# Patient Record
Sex: Male | Born: 2000 | Race: White | Hispanic: No | Marital: Single | Smoking: Never smoker
Health system: Southern US, Community
[De-identification: ages and names within clinical notes are randomized; demographics above are authoritative.]

## PROBLEM LIST (undated history)

## (undated) DIAGNOSIS — W3400XA Accidental discharge from unspecified firearms or gun, initial encounter: Secondary | ICD-10-CM

## (undated) DIAGNOSIS — Y249XXA Unspecified firearm discharge, undetermined intent, initial encounter: Secondary | ICD-10-CM

## (undated) HISTORY — PX: RENAL BIOPSY: SHX156

---
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
## 2019-01-22 ENCOUNTER — Telehealth: Payer: Self-pay | Admitting: Nurse Practitioner

## 2019-01-22 DIAGNOSIS — Z20822 Contact with and (suspected) exposure to covid-19: Secondary | ICD-10-CM

## 2019-01-22 DIAGNOSIS — R6889 Other general symptoms and signs: Principal | ICD-10-CM

## 2019-01-22 NOTE — Progress Notes (Signed)
E-Visit for Corona Virus Screening  Based on your current symptoms, you may very well have the virus, however your symptoms are mild. Currently, not all patients are being tested. If the symptoms are mild and there is not a known exposure, performing the test is not indicated.  Coronavirus disease 2019 (COVID-19) is a respiratory illness that can spread from person to person. The virus that causes COVID-19 is a new virus that was first identified in the country of [place] but is now found in multiple other countries and has spread to the [place].  Symptoms associated with the virus are mild to severe fever, cough, and shortness of breath. There is currently no vaccine to protect against COVID-19, and there is no specific antiviral treatment for the virus.   To be considered HIGH RISK for Coronavirus (COVID-19), you have to meet the following criteria:  . Traveled to [place], [place], [place], [place] or [place]; or in the [place] to [place], [place], [place], or [place]; and have fever, cough, and shortness of breath within the last 2 weeks of travel OR  . Been in close contact with a person diagnosed with COVID-19 within the last 2 weeks and have fever, cough, and shortness of breath  . IF YOU DO NOT MEET THESE CRITERIA, YOU ARE CONSIDERED LOW RISK FOR COVID-19.   It is vitally important that if you feel that you have an infection such as this virus or any other virus that you stay home and away from places where you may spread it to others.  You should self-quarantine for 14 days if you have symptoms that could potentially be coronavirus and avoid contact with people age 65 and older.   You can use medication such as delsym or mucinex OTC for cough  You may also take acetaminophen (Tylenol) as needed for fever.   Reduce your risk of any infection by using the same precautions used for avoiding the common cold or flu:  . Wash your hands often with soap and warm water for at least  20 seconds.  If soap and water are not readily available, use an alcohol-based hand sanitizer with at least 60% alcohol.  . If coughing or sneezing, cover your mouth and nose by coughing or sneezing into the elbow areas of your shirt or coat, into a tissue or into your sleeve (not your hands). . Avoid shaking hands with others and consider head nods or verbal greetings only. . Avoid touching your eyes, nose, or mouth with unwashed hands.  . Avoid close contact with people who are sick. . Avoid places or events with large numbers of people in one location, like concerts or sporting events. . Carefully consider travel plans you have or are making. . If you are planning any travel outside or inside the [place], visit the CDC's Travelers' Health webpage for the latest health notices. . If you have some symptoms but not all symptoms, continue to monitor at home and seek medical attention if your symptoms worsen. . If you are having a medical emergency, call 911.  HOME CARE . Only take medications as instructed by your medical team. . Drink plenty of fluids and get plenty of rest. . A steam or ultrasonic humidifier can help if you have congestion.   GET HELP RIGHT AWAY IF: . You develop worsening fever. . You become short of breath . You cough up blood. . Your symptoms become more severe MAKE SURE YOU   Understand these   instructions.  Will watch your condition.  Will get help right away if you are not doing well or get worse.  Your e-visit answers were reviewed by a board certified advanced clinical practitioner to complete your personal care plan.  Depending on the condition, your plan could have included both over the counter or prescription medications.  If there is a problem please reply once you have received a response from your provider. Your safety is important to us.  If you have drug allergies check your prescription carefully.    You can use MyChart to ask questions about today's  visit, request a non-urgent call back, or ask for a work or school excuse for 24 hours related to this e-Visit. If it has been greater than 24 hours you will need to follow up with your provider, or enter a new e-Visit to address those concerns. You will get an e-mail in the next two days asking about your experience.  I hope that your e-visit has been valuable and will speed your recovery. Thank you for using e-visits.   5 minutes spent reviewing and documenting in chart.  

## 2019-08-30 ENCOUNTER — Ambulatory Visit (INDEPENDENT_AMBULATORY_CARE_PROVIDER_SITE_OTHER): Payer: Self-pay | Admitting: Family Medicine

## 2019-08-30 ENCOUNTER — Other Ambulatory Visit: Payer: Self-pay

## 2019-08-30 ENCOUNTER — Ambulatory Visit
Admission: EM | Admit: 2019-08-30 | Discharge: 2019-08-30 | Disposition: A | Discharge status: Home / Self Care | Payer: Self-pay | Attending: Emergency Medicine | Admitting: Emergency Medicine

## 2019-08-30 ENCOUNTER — Encounter: Payer: Self-pay | Admitting: Family Medicine

## 2019-08-30 DIAGNOSIS — R112 Nausea with vomiting, unspecified: Secondary | ICD-10-CM

## 2019-08-30 DIAGNOSIS — R509 Fever, unspecified: Secondary | ICD-10-CM

## 2019-08-30 DIAGNOSIS — Z20828 Contact with and (suspected) exposure to other viral communicable diseases: Secondary | ICD-10-CM

## 2019-08-30 DIAGNOSIS — B349 Viral infection, unspecified: Secondary | ICD-10-CM

## 2019-08-30 DIAGNOSIS — Z20822 Contact with and (suspected) exposure to covid-19: Secondary | ICD-10-CM

## 2019-08-30 MED ORDER — PROMETHAZINE HCL 25 MG PO TABS
25.0000 mg | ORAL_TABLET | Freq: Three times a day (TID) | ORAL | 0 refills | Status: DC | PRN
Start: 2019-08-30 — End: 2019-09-06

## 2019-08-30 NOTE — Discharge Instructions (Addendum)
COVID testing ordered.  It will take between 5-7 days for test results.  Someone will contact you regarding abnormal results.    In the meantime: You should remain isolated in your home for 10 days from symptom onset AND greater than 72 hours after symptoms resolution (absence of fever without the use of fever-reducing medication and improvement in respiratory symptoms), whichever is longer Get plenty of rest and push fluids Begin phenergan as prescribed for nausea Use OTC medications like ibuprofen or tylenol as needed fever or pain Call or go to the ED if you have any new or worsening symptoms such as fever, worsening cough, shortness of breath, chest tightness, chest pain, turning blue, changes in mental status, etc..Marland Kitchen

## 2019-08-30 NOTE — ED Triage Notes (Signed)
Pt presents for covid testing after video visit with primary, symptoms of nausea and vomiting that began this morning . Pt states his dad has been sick as well but uncertain if positive, test is pending

## 2019-08-30 NOTE — Progress Notes (Signed)
Virtual Visit via Telephone Note  I connected with Christopher Holden. on 08/30/19 at 12:40pm  by telephone and verified that I am speaking with the correct person using two identifiers.      Pt location: at home   Physician location:  In office, [facility], Vic Blackbird MD     On call: patient and physician   I discussed the limitations, risks, security and privacy concerns of performing an evaluation and management service by telephone and the availability of in person appointments. I also discussed with the patient that there may be a patient responsible charge related to this service. The patient expressed understanding and agreed to proceed.   History of Present Illness:  His Father has been sick for past 3 day, with cough, congestion, HA.  Last night he had fever, was very hot and sweaty , states temp was 99.61F, he had headache and overall felt unwell. This AM got up and felt very queasy and sick on the stomach.    He has vomiting x 4 since this early morning  he had mild cough with congestion since last week.. + Fatigue  No diarrhea  Too Dramanine to help nausea this AM  He does work for [employer] and goes in and out of homes a few times a day    [employer]   [address]      Observations/Objective: NAD noted on phone, speaking full sentences   Assessment and Plan: Viral illness- concern for COVID-19 with sick contact in household, N/V. Send for testing  for the N/V given phenergan tablets. If unable to tolerate fluids despite meds, pt to go to nearest ER Given note for his work   Follow Up Instructions:    I discussed the assessment and treatment plan with the patient. The patient was provided an opportunity to ask questions and all were answered. The patient agreed with the plan and demonstrated an understanding of the instructions.   The patient was advised to call back or seek an in-person evaluation if the  symptoms worsen or if the condition fails to improve as anticipated.  I provided 8 minutes of non-face-to-face time during this encounter. End time 12:48pm  Vic Blackbird, MD

## 2019-08-30 NOTE — ED Provider Notes (Signed)
Christopher Holden   [id number] 08/30/19 Arrival Time: 4098   CC: Nausea, vomiting, COVID test  SUBJECTIVE: History from: patient.  Christopher Holden. is a 18 y.o. male who presents with nausea and 4 episodes of vomiting that began this morning.  Reports father with similar symptoms, COVID test pending.  Denies recent travel.  Has NOT tried OTC medications.  Had VV with PCP this morning, and sent in phenergan and recommended COVID testing.  Symptoms are made worse with eating, but keeping sips of fluids down without difficulty.   Denies fever, chills, fatigue, sinus pain, rhinorrhea, congestion, sore throat, cough, SOB, wheezing, chest pain, changes in bowel or bladder habits.    ROS: As per HPI.  All other pertinent ROS negative.     History reviewed. No pertinent past medical history. Past Surgical History:  Procedure Laterality Date  . RENAL BIOPSY     No Known Allergies No current facility-administered medications on file prior to encounter.    Current Outpatient Medications on File Prior to Encounter  Medication Sig Dispense Refill  . promethazine (PHENERGAN) 25 MG tablet Take 1 tablet (25 mg total) by mouth every 8 (eight) hours as needed for nausea or vomiting. 12 tablet 0   Social History   Socioeconomic History  . Marital status: Single    Spouse name: Not on file  . Number of children: Not on file  . Years of education: Not on file  . Highest education level: Not on file  Occupational History  . Not on file  Social Needs  . Financial resource strain: Not on file  . Food insecurity    Worry: Not on file    Inability: Not on file  . Transportation needs    Medical: Not on file    Non-medical: Not on file  Tobacco Use  . Smoking status: Never Smoker  . Smokeless tobacco: Never Used  Substance and Sexual Activity  . Alcohol use: No  . Drug use: Yes    Frequency: 1.0 times per week    Types: Marijuana  . Sexual activity: Not on file  Lifestyle  .  Physical activity    Days per week: Not on file    Minutes per session: Not on file  . Stress: Not on file  Relationships  . Social Herbalist on phone: Not on file    Gets together: Not on file    Attends religious service: Not on file    Active member of club or organization: Not on file    Attends meetings of clubs or organizations: Not on file    Relationship status: Not on file  . Intimate partner violence    Fear of current or ex partner: Not on file    Emotionally abused: Not on file    Physically abused: Not on file    Forced sexual activity: Not on file  Other Topics Concern  . Not on file  Social History Narrative  . Not on file   Family History  Family history unknown: Yes    OBJECTIVE:  Vitals:   08/30/19 1359  BP: 122/71  Pulse: 92  Resp: 20  Temp: 98.7 F (37.1 C)  SpO2: 96%     General appearance: alert; well-appearing, nontoxic; speaking in full sentences and tolerating own secretions HEENT: NCAT; Ears: EACs clear, TMs pearly gray; Eyes: PERRL.  EOM grossly intact. Nose: nares patent without rhinorrhea, Throat: oropharynx clear, tonsils non erythematous or enlarged, uvula midline  Neck: supple without LAD Lungs: unlabored respirations, symmetrical air entry; cough: absent; no respiratory distress; CTAB Heart: regular rate and rhythm.  Abdomen: soft, nondistended, normal active bowel sounds; nontender to palpation; no guarding  Back: no CVA tenderness Skin: warm and dry Psychological: alert and cooperative; normal mood and affect  ASSESSMENT & PLAN:  1. Suspected COVID-19 virus infection   2. Exposure to COVID-19 virus   3. Non-intractable vomiting with nausea, unspecified vomiting type    COVID testing ordered.  It will take between 5-7 days for test results.  Someone will contact you regarding abnormal results.    In the meantime: You should remain isolated in your home for 10 days from symptom onset AND greater than 72 hours after  symptoms resolution (absence of fever without the use of fever-reducing medication and improvement in respiratory symptoms), whichever is longer Get plenty of rest and push fluids Begin phenergan as prescribed for nausea Use OTC medications like ibuprofen or tylenol as needed fever or pain Call or go to the ED if you have any new or worsening symptoms such as fever, worsening cough, shortness of breath, chest tightness, chest pain, turning blue, changes in mental status, etc...   Reviewed expectations re: course of current medical issues. Questions answered. Outlined signs and symptoms indicating need for more acute intervention. Patient verbalized understanding. After Visit Summary given.         Rennis Harding, PA-C 08/30/19 1433

## 2019-08-31 LAB — NOVEL CORONAVIRUS, NAA: SARS-CoV-2, NAA: NOT DETECTED

## 2019-09-06 ENCOUNTER — Other Ambulatory Visit: Payer: Self-pay | Admitting: Family Medicine

## 2019-09-06 MED ORDER — PROMETHAZINE HCL 25 MG PO TABS: 25.0000 mg | ORAL_TABLET | Freq: Three times a day (TID) | ORAL | 0 refills | Status: DC | PRN

## 2019-10-01 ENCOUNTER — Other Ambulatory Visit: Payer: Self-pay

## 2019-10-01 DIAGNOSIS — Z20822 Contact with and (suspected) exposure to covid-19: Secondary | ICD-10-CM

## 2019-10-01 DIAGNOSIS — Z20828 Contact with and (suspected) exposure to other viral communicable diseases: Secondary | ICD-10-CM | POA: Diagnosis not present

## 2019-10-02 LAB — NOVEL CORONAVIRUS, NAA: SARS-CoV-2, NAA: NOT DETECTED

## 2019-10-04 ENCOUNTER — Ambulatory Visit (INDEPENDENT_AMBULATORY_CARE_PROVIDER_SITE_OTHER): Payer: Self-pay | Admitting: Family Medicine

## 2019-10-04 DIAGNOSIS — A084 Viral intestinal infection, unspecified: Secondary | ICD-10-CM

## 2019-10-04 MED ORDER — DIPHENOXYLATE-ATROPINE 2.5-0.025 MG PO TABS: 2.0000 | ORAL_TABLET | Freq: Four times a day (QID) | ORAL | 0 refills | Status: DC | PRN

## 2019-10-04 MED ORDER — ONDANSETRON HCL 4 MG PO TABS: 4.0000 mg | ORAL_TABLET | Freq: Three times a day (TID) | ORAL | 0 refills | Status: DC | PRN

## 2019-10-04 NOTE — Progress Notes (Signed)
Subjective:    Patient ID: Christopher Fix., male    DOB: [birth year], 18 y.o.   MRN: [id number]  HPI Patient is being seen today as a telephone visit.  Phone call began at 1210.  Phone call concluded at 1220.  Patient states his symptoms began last Thursday.  Around Thursday at 7 PM he developed some vague crampy abdominal pain in the epigastric area.  He developed a low-grade headache.  This occurred after eating dinner.  Friday early morning he vomited 4 times in rapid succession.  Since that time he has not vomited anymore however he feels extremely nauseated.  He is unable to eat anything due to lack of appetite and nausea.  If he does eat anything within 30 minutes he is having to go to the bathroom which is severe watery diarrhea.  He denies any hematochezia.  He is having crampy abdominal discomfort but no severe pain.  He does report chills.  He reports a low-grade temperature to 99.8.  He denies any rhinorrhea, cough, congestion, shortness of breath.  He tested negative for Covid on Friday. No past medical history on file. Past Surgical History:  Procedure Laterality Date  . RENAL BIOPSY     Current Outpatient Medications on File Prior to Visit  Medication Sig Dispense Refill  . promethazine (PHENERGAN) 25 MG tablet Take 1 tablet (25 mg total) by mouth every 8 (eight) hours as needed for nausea or vomiting. 12 tablet 0   No current facility-administered medications on file prior to visit.   No Known Allergies Social History   Socioeconomic History  . Marital status: Single    Spouse name: Not on file  . Number of children: Not on file  . Years of education: Not on file  . Highest education level: Not on file  Occupational History  . Not on file  Tobacco Use  . Smoking status: Never Smoker  . Smokeless tobacco: Never Used  Substance and Sexual Activity  . Alcohol use: No  . Drug use: Yes    Frequency: 1.0 times per week    Types: Marijuana  . Sexual activity: Not on  file  Other Topics Concern  . Not on file  Social History Narrative  . Not on file   Social Determinants of Health   Financial Resource Strain:   . Difficulty of Paying Living Expenses: Not on file  Food Insecurity:   . Worried About Charity fundraiser in the Last Year: Not on file  . Ran Out of Food in the Last Year: Not on file  Transportation Needs:   . Lack of Transportation (Medical): Not on file  . Lack of Transportation (Non-Medical): Not on file  Physical Activity:   . Days of Exercise per Week: Not on file  . Minutes of Exercise per Session: Not on file  Stress:   . Feeling of Stress : Not on file  Social Connections:   . Frequency of Communication with Friends and Family: Not on file  . Frequency of Social Gatherings with Friends and Family: Not on file  . Attends Religious Services: Not on file  . Active Member of Clubs or Organizations: Not on file  . Attends Archivist Meetings: Not on file  . Marital Status: Not on file  Intimate Partner Violence:   . Fear of Current or Ex-Partner: Not on file  . Emotionally Abused: Not on file  . Physically Abused: Not on file  . Sexually Abused: Not  on file      Review of Systems  All other systems reviewed and are negative.      Objective:   Physical Exam        Assessment & Plan:  Viral gastroenteritis  Patient consented to be seen over the telephone.  Patient symptoms sound consistent with viral gastroenteritis versus food poisoning.  I favor viral gastroenteritis given the low-grade fever.  Recommended Zofran 4 mg every 6-8 hours as needed for nausea.  Recommended a brat diet.  Recommended that he push fluids.  He can use Lomotil 1 to 2 tablets every 6 hours as needed for diarrhea.  Reassess if no better in 48 hours or sooner if worsening.

## 2019-10-12 ENCOUNTER — Telehealth: Payer: Self-pay | Admitting: Emergency Medicine

## 2019-10-12 DIAGNOSIS — R1084 Generalized abdominal pain: Secondary | ICD-10-CM

## 2019-10-12 NOTE — Progress Notes (Signed)
Based on what you shared with me, I feel your condition warrants further evaluation and I recommend that you be seen for a face to face office visit.  While this may be nothing more than constipation, I cannot adequately exam or diagnose you via e-visit.  You should be seen in person.   NOTE: If you entered your credit card information for this eVisit, you will not be charged. You may see a "hold" on your card for the $35 but that hold will drop off and you will not have a charge processed.   If you are having a true medical emergency please call 911.      For an urgent face to face visit, [place] has five urgent care centers for your convenience:      NEW:  [facility] [place] Get Driving Directions [phone] [address] [place], [place] [address] . 10 am - 6pm Monday - Friday    [facility]) Get Driving Directions [phone] [address] [place], [place] [address] . 10 am to 8 pm Monday-Friday . 12 pm to 8 pm [facility] Get Driving Directions [phone] [place], [place], [place] [address] . 8 am to 8 pm Monday-Friday . 9 am to 6 pm Saturday . 11 am to 6 pm Sunday     [facility] Get Driving Directions  [phone] [address], [place] [address] . 8 am to 8 pm Monday-Friday . 8 am to 4 pm [facility] [place] Get Driving Directions [phone] [address] [place], [place] [address] . 12 pm to 6 pm Monday-Friday      Your e-visit answers were reviewed by a board certified advanced clinical practitioner to complete your personal care plan.  Thank you for using e-Visits.   Approximately 5 minutes was used in reviewing the patient's chart, questionnaire, prescribing medications, and documentation.

## 2019-10-15 ENCOUNTER — Other Ambulatory Visit: Payer: Self-pay

## 2019-10-15 ENCOUNTER — Emergency Department (HOSPITAL_COMMUNITY): Payer: No Typology Code available for payment source

## 2019-10-15 ENCOUNTER — Emergency Department (HOSPITAL_COMMUNITY)
Admission: EM | Admit: 2019-10-15 | Discharge: 2019-10-16 | Disposition: A | Discharge status: Home / Self Care | Payer: No Typology Code available for payment source | Attending: Emergency Medicine | Admitting: Emergency Medicine

## 2019-10-15 ENCOUNTER — Encounter (HOSPITAL_COMMUNITY): Payer: Self-pay | Admitting: Emergency Medicine

## 2019-10-15 DIAGNOSIS — Z20828 Contact with and (suspected) exposure to other viral communicable diseases: Secondary | ICD-10-CM | POA: Insufficient documentation

## 2019-10-15 DIAGNOSIS — Y929 Unspecified place or not applicable: Secondary | ICD-10-CM | POA: Insufficient documentation

## 2019-10-15 DIAGNOSIS — S21201A Unspecified open wound of right back wall of thorax without penetration into thoracic cavity, initial encounter: Secondary | ICD-10-CM | POA: Diagnosis not present

## 2019-10-15 DIAGNOSIS — W3400XA Accidental discharge from unspecified firearms or gun, initial encounter: Secondary | ICD-10-CM | POA: Insufficient documentation

## 2019-10-15 DIAGNOSIS — Y9389 Activity, other specified: Secondary | ICD-10-CM | POA: Insufficient documentation

## 2019-10-15 DIAGNOSIS — S299XXA Unspecified injury of thorax, initial encounter: Secondary | ICD-10-CM | POA: Diagnosis present

## 2019-10-15 DIAGNOSIS — Y998 Other external cause status: Secondary | ICD-10-CM | POA: Insufficient documentation

## 2019-10-15 LAB — COMPREHENSIVE METABOLIC PANEL
ALT: 25 U/L (ref 0–44)
AST: 35 U/L (ref 15–41)
Albumin: 4.6 g/dL (ref 3.5–5.0)
Alkaline Phosphatase: 64 U/L (ref 38–126)
Anion gap: 12 (ref 5–15)
BUN: 13 mg/dL (ref 6–20)
CO2: 22 mmol/L (ref 22–32)
Calcium: 9.7 mg/dL (ref 8.9–10.3)
Chloride: 103 mmol/L (ref 98–111)
Creatinine, Ser: 1.01 mg/dL (ref 0.61–1.24)
GFR calc Af Amer: >60 mL/min (ref >60)
GFR calc non Af Amer: >60 mL/min (ref >60)
Glucose, Bld: 185 mg/dL — ABNORMAL HIGH (ref 70–99)
Potassium: 3.5 mmol/L (ref 3.5–5.1)
Sodium: 137 mmol/L (ref 135–145)
Total Bilirubin: 0.4 mg/dL (ref 0.3–1.2)
Total Protein: 7.4 g/dL (ref 6.5–8.1)

## 2019-10-15 LAB — I-STAT CHEM 8, ED
BUN: 15 mg/dL (ref 6–20)
Calcium, Ion: 1.17 mmol/L (ref 1.15–1.40)
Chloride: 102 mmol/L (ref 98–111)
Creatinine, Ser: 0.9 mg/dL (ref 0.61–1.24)
Glucose, Bld: 180 mg/dL — ABNORMAL HIGH (ref 70–99)
HCT: 47 % (ref 39.0–52.0)
Hemoglobin: 16 g/dL (ref 13.0–17.0)
Potassium: 3.4 mmol/L — ABNORMAL LOW (ref 3.5–5.1)
Sodium: 138 mmol/L (ref 135–145)
TCO2: 24 mmol/L (ref 22–32)

## 2019-10-15 LAB — CBC
HCT: 45 % (ref 39.0–52.0)
Hemoglobin: 16 g/dL (ref 13.0–17.0)
MCH: 30.7 pg (ref 26.0–34.0)
MCHC: 35.6 g/dL (ref 30.0–36.0)
MCV: 86.2 fL (ref 80.0–100.0)
Platelets: 238 10*3/uL (ref 150–400)
RBC: 5.22 MIL/uL (ref 4.22–5.81)
RDW: 11.7 % (ref 11.5–15.5)
WBC: 13.4 10*3/uL — ABNORMAL HIGH (ref 4.0–10.5)
nRBC: 0 % (ref 0.0–0.2)

## 2019-10-15 LAB — RESPIRATORY PANEL BY RT PCR (FLU A&B, COVID)
Influenza A by PCR: NEGATIVE
Influenza B by PCR: NEGATIVE
SARS Coronavirus 2 by RT PCR: NEGATIVE

## 2019-10-15 LAB — PROTIME-INR
INR: 0.9 (ref 0.8–1.2)
Prothrombin Time: 12.4 seconds (ref 11.4–15.2)

## 2019-10-15 LAB — ETHANOL: Alcohol, Ethyl (B): <10 mg/dL (ref <10)

## 2019-10-15 LAB — SAMPLE TO BLOOD BANK

## 2019-10-15 LAB — CBG MONITORING, ED: Glucose-Capillary: 169 mg/dL — ABNORMAL HIGH (ref 70–99)

## 2019-10-15 LAB — LACTIC ACID, PLASMA: Lactic Acid, Venous: 3 mmol/L (ref 0.5–1.9)

## 2019-10-15 LAB — CDS SEROLOGY

## 2019-10-15 MED ORDER — TETANUS-DIPHTH-ACELL PERTUSSIS 5-2.5-18.5 LF-MCG/0.5 IM SUSP
0.5000 mL | Freq: Once | INTRAMUSCULAR | Status: AC
  Administered 2019-10-15: 0.5 mL via INTRAMUSCULAR

## 2019-10-15 MED ORDER — CEFAZOLIN SODIUM-DEXTROSE 2-4 GM/100ML-% IV SOLN
2.0000 g | Freq: Once | INTRAVENOUS | Status: AC
  Administered 2019-10-15: 2 g via INTRAVENOUS

## 2019-10-15 MED ORDER — SODIUM CHLORIDE 0.9 % IV SOLN
INTRAVENOUS | Status: AC | PRN
  Administered 2019-10-15: 1000 mL via INTRAVENOUS

## 2019-10-15 MED ORDER — SODIUM CHLORIDE 0.9 % IV BOLUS
2000.0000 mL | Freq: Once | INTRAVENOUS | Status: AC
  Administered 2019-10-15: 2000 mL via INTRAVENOUS

## 2019-10-15 MED ORDER — IOHEXOL 350 MG/ML SOLN
100.0000 mL | Freq: Once | INTRAVENOUS | Status: AC | PRN
  Administered 2019-10-15: 100 mL via INTRAVENOUS

## 2019-10-15 MED ORDER — LORAZEPAM 2 MG/ML IJ SOLN
1.0000 mg | Freq: Once | INTRAMUSCULAR | Status: AC
  Administered 2019-10-15: 1 mg via INTRAVENOUS
  Filled 2019-10-15: qty 1

## 2019-10-15 MED ORDER — SODIUM CHLORIDE 0.9 % IV SOLN: INTRAVENOUS | Status: DC

## 2019-10-15 NOTE — ED Notes (Signed)
MD informed of 3.0 lactic acid
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
## 2019-10-16 LAB — URINALYSIS, ROUTINE W REFLEX MICROSCOPIC
Bilirubin Urine: NEGATIVE
Glucose, UA: NEGATIVE mg/dL
Hgb urine dipstick: NEGATIVE
Ketones, ur: NEGATIVE mg/dL
Leukocytes,Ua: NEGATIVE
Nitrite: NEGATIVE
Protein, ur: NEGATIVE mg/dL
Specific Gravity, Urine: 1.028 (ref 1.005–1.030)
pH: 6 (ref 5.0–8.0)

## 2019-10-16 LAB — RAPID URINE DRUG SCREEN, HOSP PERFORMED
Amphetamines: NOT DETECTED
Barbiturates: NOT DETECTED
Benzodiazepines: NOT DETECTED
Cocaine: NOT DETECTED
Opiates: NOT DETECTED
Tetrahydrocannabinol: POSITIVE — AB

## 2019-10-16 LAB — LACTIC ACID, PLASMA: Lactic Acid, Venous: 1.3 mmol/L (ref 0.5–1.9)

## 2019-10-16 MED ORDER — MORPHINE SULFATE (PF) 4 MG/ML IV SOLN
4.0000 mg | Freq: Once | INTRAVENOUS | Status: AC
  Administered 2019-10-16: 4 mg via INTRAVENOUS
  Filled 2019-10-16: qty 1

## 2019-10-16 MED ORDER — IBUPROFEN 800 MG PO TABS: 800.0000 mg | ORAL_TABLET | Freq: Three times a day (TID) | ORAL | 0 refills | Status: DC | PRN

## 2019-10-16 MED ORDER — CEPHALEXIN 500 MG PO CAPS: 500.0000 mg | ORAL_CAPSULE | Freq: Two times a day (BID) | ORAL | 0 refills | Status: DC

## 2019-10-16 MED ORDER — HYDROCODONE-ACETAMINOPHEN 5-325 MG PO TABS: 1.0000 | ORAL_TABLET | ORAL | 0 refills | Status: DC | PRN

## 2019-10-16 MED ORDER — ONDANSETRON HCL 4 MG PO TABS: 4.0000 mg | ORAL_TABLET | Freq: Four times a day (QID) | ORAL | 0 refills | Status: DC

## 2019-10-16 NOTE — ED Notes (Signed)
Dressing care provided before discharge. Instructed pt on wound care and follow up. Provided materials to perform dressing care changes at home
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
## 2019-11-18 ENCOUNTER — Encounter: Payer: Self-pay | Admitting: Family Medicine

## 2020-01-03 ENCOUNTER — Other Ambulatory Visit: Payer: Self-pay

## 2020-01-03 ENCOUNTER — Ambulatory Visit (HOSPITAL_COMMUNITY)
Admission: EM | Admit: 2020-01-03 | Discharge: 2020-01-03 | Disposition: A | Discharge status: Home / Self Care | Payer: Medicaid Other | Attending: Family Medicine | Admitting: Family Medicine

## 2020-01-03 ENCOUNTER — Encounter (HOSPITAL_COMMUNITY): Payer: Self-pay | Admitting: Emergency Medicine

## 2020-01-03 DIAGNOSIS — R5383 Other fatigue: Secondary | ICD-10-CM

## 2020-01-03 DIAGNOSIS — B349 Viral infection, unspecified: Secondary | ICD-10-CM | POA: Insufficient documentation

## 2020-01-03 DIAGNOSIS — Z20822 Contact with and (suspected) exposure to covid-19: Secondary | ICD-10-CM | POA: Diagnosis not present

## 2020-01-03 DIAGNOSIS — R519 Headache, unspecified: Secondary | ICD-10-CM

## 2020-01-03 DIAGNOSIS — M791 Myalgia, unspecified site: Secondary | ICD-10-CM | POA: Diagnosis not present

## 2020-01-03 DIAGNOSIS — R112 Nausea with vomiting, unspecified: Secondary | ICD-10-CM | POA: Diagnosis not present

## 2020-01-03 MED ORDER — ONDANSETRON HCL 4 MG PO TABS: 4.0000 mg | ORAL_TABLET | Freq: Four times a day (QID) | ORAL | 0 refills | Status: DC

## 2020-01-03 MED ORDER — ONDANSETRON 4 MG PO TBDP
4.0000 mg | ORAL_TABLET | Freq: Once | ORAL | Status: AC
  Administered 2020-01-03: 13:00:00 4 mg via ORAL

## 2020-01-03 MED ORDER — ONDANSETRON 4 MG PO TBDP
ORAL_TABLET | ORAL | Status: AC
  Filled 2020-01-03: qty 1

## 2020-01-03 NOTE — ED Triage Notes (Signed)
Runny nose, vomiting, headache, nausea No diarrhea Patient has had symptoms for a few days.    patient needs a rapid test, we do not do these.  Patient is planning to go somewhere else for covid testing Wants symptoms evaluated here

## 2020-01-03 NOTE — Discharge Instructions (Signed)
Your COVID test is pending.  You should self quarantine until your test result is back and is negative.    Take Tylenol as needed for fever or discomfort.  Rest and keep yourself hydrated.    Go to the emergency department if you develop high fever, shortness of breath, severe diarrhea, or other concerning symptoms.    

## 2020-01-03 NOTE — ED Provider Notes (Signed)
[place]    CSN: [id number] Arrival date & time: 01/03/20  1154      History   Chief Complaint No chief complaint on file.   HPI Christopher Holden. is a 19 y.o. male.   Reports runny nose, nausea, vomiting, headache, fatigue, muscle aches for the last 2 days.  Inquiring about rapid Covid test, informed that we do not do these here but results will come back in 6 to 24 hours.  Agreeable to Covid testing here today.  Has made no attempts to treat this at home.  No significant medical history.  Denies sick contacts, diarrhea, rash, other symptoms.  ROS per HPI  The history is provided by the patient.    History reviewed. No pertinent past medical history.  Patient Active Problem List   Diagnosis Date Noted  . Chronic constipation 07/03/2018  . Essential hypertension, benign 11/30/2014  . Insomnia 11/30/2014  . Obesity 07/31/2014  . Gastroenteritis 07/29/2014    Past Surgical History:  Procedure Laterality Date  . RENAL BIOPSY    . RENAL BIOPSY     States he had one when he was 19 years old       Home Medications    Prior to Admission medications   Medication Sig Start Date End Date Taking? Authorizing Provider  ibuprofen (ADVIL) 800 MG tablet Take 1 tablet (800 mg total) by mouth every 8 (eight) hours as needed for mild pain. 10/16/19  Yes Ward, Delice Bison, DO  cephALEXin (KEFLEX) 500 MG capsule Take 1 capsule (500 mg total) by mouth 2 (two) times daily. 10/16/19   Ward, Delice Bison, DO  diphenoxylate-atropine (LOMOTIL) 2.5-0.025 MG tablet Take 2 tablets by mouth 4 (four) times daily as needed for diarrhea or loose stools. 10/04/19   Susy Frizzle, MD  HYDROcodone-acetaminophen (NORCO/VICODIN) 5-325 MG tablet Take 1 tablet by mouth every 4 (four) hours as needed. 10/16/19   Ward, Delice Bison, DO  ondansetron (ZOFRAN) 4 MG tablet Take 1 tablet (4 mg total) by mouth every 6 (six) hours. 01/03/20   Faustino Congress, NP  promethazine (PHENERGAN) 25 MG  tablet Take 1 tablet (25 mg total) by mouth every 8 (eight) hours as needed for nausea or vomiting. 09/06/19   Susy Frizzle, MD    Family History Family History  Family history unknown: Yes    Social History Social History   Tobacco Use  . Smoking status: Never Smoker  . Smokeless tobacco: Never Used  Substance Use Topics  . Alcohol use: No  . Drug use: Yes    Frequency: 1.0 times per week    Types: Marijuana     Allergies   Patient has no known allergies.   Review of Systems Review of Systems   Physical Exam Triage Vital Signs ED Triage Vitals  Enc Vitals Group     BP 01/03/20 1249 (!) 120/57     Pulse Rate 01/03/20 1249 83     Resp 01/03/20 1249 16     Temp 01/03/20 1249 98.3 F (36.8 C)     Temp Source 01/03/20 1249 Oral     SpO2 01/03/20 1249 99 %     Weight --      Height --      Head Circumference --      Peak Flow --      Pain Score 01/03/20 1246 0     Pain Loc --      Pain Edu? --  Excl. in GC? --    No data found.  Updated Vital Signs BP (!) 120/57 (BP Location: Right Arm)   Pulse 83   Temp 98.3 F (36.8 C) (Oral)   Resp 16   SpO2 99%   Visual Acuity Right Eye Distance:   Left Eye Distance:   Bilateral Distance:    Right Eye Near:   Left Eye Near:    Bilateral Near:     Physical Exam Vitals and nursing note reviewed.  Constitutional:      General: He is not in acute distress.    Appearance: Normal appearance. He is well-developed and normal weight. He is ill-appearing.  HENT:     Head: Normocephalic and atraumatic.     Right Ear: Tympanic membrane normal.     Left Ear: Tympanic membrane normal.     Nose: Congestion and rhinorrhea present.     Mouth/Throat:     Mouth: Mucous membranes are moist.  Eyes:     Conjunctiva/sclera: Conjunctivae normal.  Cardiovascular:     Rate and Rhythm: Normal rate and regular rhythm.     Heart sounds: Normal heart sounds. No murmur.  Pulmonary:     Effort: Pulmonary effort is  normal. No respiratory distress.     Breath sounds: Normal breath sounds. No stridor. No wheezing, rhonchi or rales.  Chest:     Chest wall: No tenderness.  Abdominal:     General: Bowel sounds are normal. There is no distension.     Palpations: Abdomen is soft. There is no mass.     Tenderness: There is no abdominal tenderness. There is no guarding or rebound.     Hernia: No hernia is present.  Musculoskeletal:        General: Normal range of motion.     Cervical back: Normal range of motion and neck supple.  Skin:    General: Skin is warm and dry.     Capillary Refill: Capillary refill takes less than 2 seconds.  Neurological:     General: No focal deficit present.     Mental Status: He is alert and oriented to person, place, and time.  Psychiatric:        Mood and Affect: Mood normal.      UC Treatments / Results  Labs (all labs ordered are listed, but only abnormal results are displayed) Labs Reviewed  SARS CORONAVIRUS 2 (TAT 6-24 HRS)    EKG   Radiology No results found.  Procedures Procedures (including critical care time)  Medications Ordered in UC Medications  ondansetron (ZOFRAN-ODT) disintegrating tablet 4 mg (has no administration in time range)    Initial Impression / Assessment and Plan / UC Course  I have reviewed the triage vital signs and the nursing notes.  Pertinent labs & imaging results that were available during my care of the patient were reviewed by me and considered in my medical decision making (see chart for details).     Viral illness, nausea, vomiting, headache, chills, body aches, fever for the last 3 days.  Zofran 4 mg sublingual given in office today for nausea.  Zofran 4 mg sublingual prescription sent to patient's pharmacy.  Covid test obtained today, will inform patient when results are back.  Instructed to quarantine until results are back and negative.  Instructed to go to the ER for shortness of breath, trouble swallowing, high  fever, severe diarrhea, other concerning symptoms.  Work excuse given today stating that he had a Covid test here at  this office and that he may return pending negative Covid results. Final Clinical Impressions(s) / UC Diagnoses   Final diagnoses:  Viral illness  Nausea and vomiting, intractability of vomiting not specified, unspecified vomiting type     Discharge Instructions     Your COVID test is pending.  You should self quarantine until your test result is back and is negative.    Take Tylenol as needed for fever or discomfort.  Rest and keep yourself hydrated.    Go to the emergency department if you develop high fever, shortness of breath, severe diarrhea, or other concerning symptoms.       ED Prescriptions    Medication Sig Dispense Auth. Provider   ondansetron (ZOFRAN) 4 MG tablet Take 1 tablet (4 mg total) by mouth every 6 (six) hours. 12 tablet Moshe Cipro, NP     I have reviewed the PDMP during this encounter.   Moshe Cipro, NP 01/03/20 1308

## 2020-01-04 LAB — SARS CORONAVIRUS 2 (TAT 6-24 HRS): SARS Coronavirus 2: NEGATIVE

## 2020-01-10 ENCOUNTER — Ambulatory Visit (INDEPENDENT_AMBULATORY_CARE_PROVIDER_SITE_OTHER): Payer: Medicaid Other | Admitting: Nurse Practitioner

## 2020-01-10 ENCOUNTER — Other Ambulatory Visit: Payer: Self-pay

## 2020-01-10 VITALS — BP 120/68 | HR 75 | Temp 97.9°F | Resp 18 | Wt 183.0 lb

## 2020-01-10 DIAGNOSIS — Z7189 Other specified counseling: Secondary | ICD-10-CM

## 2020-01-10 DIAGNOSIS — Z87828 Personal history of other (healed) physical injury and trauma: Secondary | ICD-10-CM | POA: Diagnosis not present

## 2020-01-10 MED ORDER — IBUPROFEN 600 MG PO TABS: 600.0000 mg | ORAL_TABLET | Freq: Three times a day (TID) | ORAL | 0 refills | Status: AC | PRN
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
## 2020-01-14 DIAGNOSIS — S42114D Nondisplaced fracture of body of scapula, right shoulder, subsequent encounter for fracture with routine healing: Secondary | ICD-10-CM | POA: Diagnosis not present

## 2020-01-28 ENCOUNTER — Telehealth: Payer: Self-pay

## 2020-01-28 NOTE — Telephone Encounter (Signed)
We unfortunately do not take patient's insurance. Called patient to notify him but phone number is no longer in service.
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
## 2020-03-01 ENCOUNTER — Ambulatory Visit: Payer: Medicaid Other | Admitting: Family Medicine

## 2020-08-01 ENCOUNTER — Telehealth: Payer: Medicaid Other | Admitting: Physician Assistant

## 2020-08-01 DIAGNOSIS — R112 Nausea with vomiting, unspecified: Secondary | ICD-10-CM | POA: Diagnosis not present

## 2020-08-01 MED ORDER — ONDANSETRON HCL 4 MG PO TABS: 4.0000 mg | ORAL_TABLET | Freq: Three times a day (TID) | ORAL | 0 refills | Status: DC | PRN

## 2020-08-01 NOTE — Progress Notes (Signed)
We are sorry that you are not feeling well. Here is how we plan to help!  Based on what you have shared with me it looks like you have been vomiting.  Vomiting is the forceful emptying of a portion of the stomach's content through the mouth.  Although nausea and vomiting can make you feel miserable, it's important to remember that these are not diseases, but rather symptoms of an underlying illness.  When we treat short term symptoms, we always caution that any symptoms that persist should be fully evaluated in a medical office.  I have prescribed a medication that will help alleviate your symptoms and allow you to stay hydrated:  Zofran 4 mg 1 tablet every 8 hours as needed for nausea and vomiting  HOME CARE:  Drink clear liquids.  This is very important! Dehydration (the lack of fluid) can lead to a serious complication.  Start off with 1 tablespoon every 5 minutes for 8 hours.  You may begin eating bland foods after 8 hours without vomiting.  Start with saltine crackers, white bread, rice, mashed potatoes, applesauce.  After 48 hours on a bland diet, you may resume a normal diet.  Try to go to sleep.  Sleep often empties the stomach and relieves the need to vomit.  GET HELP RIGHT AWAY IF:   Your symptoms do not improve or worsen within 2 days after treatment.  You have a fever for over 3 days.  You cannot keep down fluids after trying the medication.  MAKE SURE YOU:   Understand these instructions.  Will watch your condition.  Will get help right away if you are not doing well or get worse.   Thank you for choosing an e-visit. Your e-visit answers were reviewed by a board certified advanced clinical practitioner to complete your personal care plan. Depending upon the condition, your plan could have included both over the counter or prescription medications. Please review your pharmacy choice. Be sure that the pharmacy you have chosen is open so that you can pick up your  prescription now.  If there is a problem you may message your provider in MyChart to have the prescription routed to another pharmacy. Your safety is important to [place]. If you have drug allergies check your prescription carefully.  For the next 24 hours, you can use MyChart to ask questions about today's visit, request a non-urgent call back, or ask for a work or school excuse from your e-visit provider. You will get an e-mail in the next two days asking about your experience. I hope that your e-visit has been valuable and will speed your recovery.  5 minutes spent charting

## 2020-08-08 ENCOUNTER — Telehealth: Payer: Medicaid Other | Admitting: Physician Assistant

## 2020-08-08 DIAGNOSIS — J069 Acute upper respiratory infection, unspecified: Secondary | ICD-10-CM

## 2020-08-08 MED ORDER — ALBUTEROL SULFATE HFA 108 (90 BASE) MCG/ACT IN AERS: 2.0000 | INHALATION_SPRAY | Freq: Four times a day (QID) | RESPIRATORY_TRACT | 0 refills | Status: AC | PRN

## 2020-08-08 MED ORDER — FLUTICASONE PROPIONATE 50 MCG/ACT NA SUSP: 2.0000 | Freq: Every day | NASAL | 0 refills | Status: DC

## 2020-08-08 MED ORDER — BENZONATATE 100 MG PO CAPS: 100.0000 mg | ORAL_CAPSULE | Freq: Three times a day (TID) | ORAL | 0 refills | Status: AC

## 2020-08-08 NOTE — Progress Notes (Signed)
E-Visit for Corona Virus Screening  Your current symptoms could be consistent with the coronavirus.  Many health care providers can now test patients at their office but not all are.  [facility] has multiple testing sites. For information on our COVID testing locations and hours go to [email or web address]  We are enrolling you in our MyChart Home Monitoring for COVID19 . Daily you will receive a questionnaire within the MyChart website. Our COVID 19 response team will be monitoring your responses daily.  Testing Information: The COVID-19 Community Testing sites are testing BY APPOINTMENT ONLY.  You can schedule online at [email or web address]  If you do not have access to a smart phone or computer you may call [phone] for an appointment.   Additional testing sites in the Community:  . For [facility] Testing sites in [facility]  [email or web address]  . For Pop-up testing sites in [place]  [email or web address]  . For [facility] [email or web address]  . For [facility] testing in [place] and [employer] [email or web address]  . For [facility] testing in [facility]   [email or web address]  For  more information about community testing call [phone]   Please quarantine yourself while awaiting your test results. Please stay home for a minimum of 10 days from the first day of illness with improving symptoms and you have had 24 hours of no fever (without the use of Tylenol (Acetaminophen) Motrin (Ibuprofen) or any fever reducing medication).  Also - Do not get tested prior to returning to work because once you have had a positive test the test can stay  positive for more than a month in some cases.   You should wear a mask or cloth face covering over your nose and mouth if you must be around other people or animals, including pets (even at home). Try to stay at least 6 feet away from other people. This will protect the people around you.  Please continue good preventive care measures, including:  frequent hand-washing, avoid touching your face, cover coughs/sneezes, stay out of crowds and keep a 6 foot distance from others.  COVID-19 is a respiratory illness with symptoms that are similar to the flu. Symptoms are typically mild to moderate, but there have been cases of severe illness and death due to the virus.   The following symptoms may appear 2-14 days after exposure: . Fever . Cough . Shortness of breath or difficulty breathing . Chills . Repeated shaking with chills . Muscle pain . Headache . Sore throat . New loss of taste or smell . Fatigue . Congestion or runny nose . Nausea or vomiting . Diarrhea  Go to the nearest hospital ED for assessment if fever/cough/breathlessness are severe or illness seems like a threat to life.  It is vitally important that if you feel that you have an infection such as this virus or any other virus that you stay home and away from places where you may spread it to others.  You should avoid contact with people age 71 and older.   You can use medication such as A prescription cough medication called Tessalon Perles 100 mg. You may take 1-2 capsules every 8 hours as needed for cough and A prescription inhaler called Albuterol MDI 90 mcg /actuation 2 puffs every 4 hours as needed for shortness of breath, wheezing, cough. I have also prescribed Fluticasone nasal spray to help with your nasal congestion.  You may also take acetaminophen (Tylenol) as needed for fever.  Reduce your risk of any infection  by using the same precautions used for avoiding the common cold or flu:  Marland Kitchen Wash your hands often with soap  and warm water for at least 20 seconds.  If soap and water are not readily available, use an alcohol-based hand sanitizer with at least 60% alcohol.  . If coughing or sneezing, cover your mouth and nose by coughing or sneezing into the elbow areas of your shirt or coat, into a tissue or into your sleeve (not your hands). . Avoid shaking hands with others and consider head nods or verbal greetings only. . Avoid touching your eyes, nose, or mouth with unwashed hands.  . Avoid close contact with people who are sick. . Avoid places or events with large numbers of people in one location, like concerts or sporting events. . Carefully consider travel plans you have or are making. . If you are planning any travel outside or inside the [place], visit the CDC's Travelers' Health webpage for the latest health notices. . If you have some symptoms but not all symptoms, continue to monitor at home and seek medical attention if your symptoms worsen. . If you are having a medical emergency, call 911.  HOME CARE . Only take medications as instructed by your medical team. . Drink plenty of fluids and get plenty of rest. . A steam or ultrasonic humidifier can help if you have congestion.   GET HELP RIGHT AWAY IF YOU HAVE EMERGENCY WARNING SIGNS** FOR COVID-19. If you or someone is showing any of these signs seek emergency medical care immediately. Call 911 or proceed to your closest emergency facility if: . You develop worsening high fever. . Trouble breathing . Bluish lips or face . Persistent pain or pressure in the chest . New confusion . Inability to wake or stay awake . You cough up blood. . Your symptoms become more severe  **This list is not all possible symptoms. Contact your medical provider for any symptoms that are sever or concerning to you.  MAKE SURE YOU   Understand these instructions.  Will watch your condition.  Will get help right away if you are not doing well or get worse.  Your  e-visit answers were reviewed by a board certified advanced clinical practitioner to complete your personal care plan.  Depending on the condition, your plan could have included both over the counter or prescription medications.  If there is a problem please reply once you have received a response from your provider.  Your safety is important to [place].  If you have drug allergies check your prescription carefully.    You can use MyChart to ask questions about today's visit, request a non-urgent call back, or ask for a work or school excuse for 24 hours related to this e-Visit. If it has been greater than 24 hours you will need to follow up with your provider, or enter a new e-Visit to address those concerns. You will get an e-mail in the next two days asking about your experience.  I hope that your e-visit has been valuable and will speed your recovery. Thank you for using e-visits.  Approximately 5 minutes was spent documenting and reviewing patient's chart.

## 2020-10-03 ENCOUNTER — Ambulatory Visit: Payer: Self-pay | Admitting: Family Medicine

## 2020-10-04 ENCOUNTER — Telehealth: Payer: Medicaid Other | Admitting: Nurse Practitioner

## 2020-10-04 DIAGNOSIS — R112 Nausea with vomiting, unspecified: Secondary | ICD-10-CM | POA: Diagnosis not present

## 2020-10-04 DIAGNOSIS — J069 Acute upper respiratory infection, unspecified: Secondary | ICD-10-CM

## 2020-10-04 MED ORDER — FLUTICASONE PROPIONATE 50 MCG/ACT NA SUSP: 2.0000 | Freq: Every day | NASAL | 6 refills | Status: DC

## 2020-10-04 MED ORDER — ONDANSETRON HCL 4 MG PO TABS: 4.0000 mg | ORAL_TABLET | Freq: Three times a day (TID) | ORAL | 0 refills | Status: AC | PRN

## 2020-10-04 NOTE — Progress Notes (Signed)
We are sorry that you are not feeling well. Here is how we plan to help!  Based on what you have shared with me it looks like you have a Virus that is irritating your GI tract.  Vomiting is the forceful emptying of a portion of the stomach's content through the mouth.  Although nausea and vomiting can make you feel miserable, it's important to remember that these are not diseases, but rather symptoms of an underlying illness.  When we treat short term symptoms, we always caution that any symptoms that persist should be fully evaluated in a medical office.  I have prescribed a medication that will help alleviate your symptoms and allow you to stay hydrated:  Zofran 4 mg 1 tablet every 8 hours as needed for nausea and vomiting  HOME CARE:  Drink clear liquids.  This is very important! Dehydration (the lack of fluid) can lead to a serious complication.  Start off with 1 tablespoon every 5 minutes for 8 hours.  You may begin eating bland foods after 8 hours without vomiting.  Start with saltine crackers, white bread, rice, mashed potatoes, applesauce.  After 48 hours on a bland diet, you may resume a normal diet.  Try to go to sleep.  Sleep often empties the stomach and relieves the need to vomit.   UPPER RESPIRATORY INFECTION  Based on what you have shared with me, it looks like you may have a viral upper respiratory infection.  Upper respiratory infections are caused by a large number of viruses; however, rhinovirus is the most common cause.   Symptoms vary from person to person, with common symptoms including sore throat, cough, fatigue or lack of energy and feeling of general discomfort.  A low-grade fever of up to 100.4 may present, but is often uncommon.  Symptoms vary however, and are closely related to a person's age or underlying illnesses.  The most common symptoms associated with an upper respiratory infection are nasal discharge or congestion, cough, sneezing, headache and pressure in  the ears and face.  These symptoms usually persist for about 3 to 10 days, but can last up to 2 weeks.  It is important to know that upper respiratory infections do not cause serious illness or complications in most cases.    Upper respiratory infections can be transmitted from person to person, with the most common method of transmission being a person's hands.  The virus is able to live on the skin and can infect other persons for up to 2 hours after direct contact.  Also, these can be transmitted when someone coughs or sneezes; thus, it is important to cover the mouth to reduce this risk.  To keep the spread of the illness at bay, good hand hygiene is very important.  This is an infection that is most likely caused by a virus. There are no specific treatments other than to help you with the symptoms until the infection runs its course.  We are sorry you are not feeling well.  Here is how we plan to help!   For nasal congestion, you may use an oral decongestants such as Mucinex D or if you have glaucoma or high blood pressure use plain Mucinex.  Saline nasal spray or nasal drops can help and can safely be used as often as needed for congestion.  For your congestion, I have prescribed Fluticasone nasal spray one spray in each nostril twice a day  If you do not have a history of heart disease,  hypertension, diabetes or thyroid disease, prostate/bladder issues or glaucoma, you may also use Sudafed to treat nasal congestion.  It is highly recommended that you consult with a pharmacist or your primary care physician to ensure this medication is safe for you to take.     If you have a cough, you may use cough suppressants such as Delsym and Robitussin.  If you have glaucoma or high blood pressure, you can also use Coricidin HBP.    If you have a sore or scratchy throat, use a saltwater gargle-  to  teaspoon of salt dissolved in a 4-ounce to 8-ounce glass of warm water.  Gargle the solution for  approximately 15-30 seconds and then spit.  It is important not to swallow the solution.  You can also use throat lozenges/cough drops and Chloraseptic spray to help with throat pain or discomfort.  Warm or cold liquids can also be helpful in relieving throat pain.  For headache, pain or general discomfort, you can use Ibuprofen or Tylenol as directed.   Some authorities believe that zinc sprays or the use of Echinacea may shorten the course of your symptoms.   HOME CARE . Only take medications as instructed by your medical team. . Be sure to drink plenty of fluids. Water is fine as well as fruit juices, sodas and electrolyte beverages. You may want to stay away from caffeine or alcohol. If you are nauseated, try taking small sips of liquids. How do you know if you are getting enough fluid? Your urine should be a pale yellow or almost colorless. . Get rest. . Taking a steamy shower or using a humidifier may help nasal congestion and ease sore throat pain. You can place a towel over your head and breathe in the steam from hot water coming from a faucet. . Using a saline nasal spray works much the same way. . Cough drops, hard candies and sore throat lozenges may ease your cough. . Avoid close contacts especially the very young and the elderly . Cover your mouth if you cough or sneeze . Always remember to wash your hands.   GET HELP RIGHT AWAY IF: . You develop worsening fever. . If your symptoms do not improve within 10 days . You develop yellow or green discharge from your nose over 3 days. . You have coughing fits . You develop a severe head ache or visual changes. . You develop shortness of breath, difficulty breathing or start having chest pain . Your symptoms persist after you have completed your treatment plan  MAKE SURE YOU   Understand these instructions.  Will watch your condition.  Will get help right away if you are not doing well or get worse.  Your e-visit answers were  reviewed by a board certified advanced clinical practitioner to complete your personal care plan. Depending upon the condition, your plan could have included both over the counter or prescription medications. Please review your pharmacy choice. If there is a problem, you may call our nursing hot line at and have the prescription routed to another pharmacy. Your safety is important to [place]. If you have drug allergies check your prescription carefully.   You can use MyChart to ask questions about today's visit, request a non-urgent call back, or ask for a work or school excuse for 24 hours related to this e-Visit. If it has been greater than 24 hours you will need to follow up with your provider, or enter a new e-Visit to address those concerns.  You will get an e-mail in the next two days asking about your experience.  I hope that your e-visit has been valuable and will speed your recovery. Thank you for using e-visits.   5-10 minutes spent reviewing and documenting in chart.

## 2020-10-05 ENCOUNTER — Telehealth: Payer: Medicaid Other | Admitting: Emergency Medicine

## 2020-10-05 DIAGNOSIS — R059 Cough, unspecified: Secondary | ICD-10-CM

## 2020-10-05 DIAGNOSIS — J069 Acute upper respiratory infection, unspecified: Secondary | ICD-10-CM

## 2020-10-05 MED ORDER — FLUTICASONE PROPIONATE 50 MCG/ACT NA SUSP: 2.0000 | Freq: Every day | NASAL | 0 refills | Status: DC

## 2020-10-05 MED ORDER — BENZONATATE 100 MG PO CAPS: 100.0000 mg | ORAL_CAPSULE | Freq: Two times a day (BID) | ORAL | 0 refills | Status: DC | PRN

## 2020-10-05 NOTE — Progress Notes (Signed)

## 2020-10-09 ENCOUNTER — Telehealth: Payer: Medicaid Other | Admitting: Physician Assistant

## 2020-10-09 DIAGNOSIS — J069 Acute upper respiratory infection, unspecified: Secondary | ICD-10-CM

## 2020-10-09 NOTE — Progress Notes (Signed)
We are sorry you are not feeling well.  Here is how we plan to help!  I will write a note to return to work next week. It looks like you also have a prescription for Flonase which will help significantly with the nasal congestion.  Keep using the Mucinex and Tylenol!    Based on what you have shared with me, it looks like you may have a viral upper respiratory infection.  Upper respiratory infections are caused by a large number of viruses; however, rhinovirus is the most common cause.   Symptoms vary from person to person, with common symptoms including sore throat, cough, fatigue or lack of energy and feeling of general discomfort.  A low-grade fever of up to 100.4 may present, but is often uncommon.  Symptoms vary however, and are closely related to a person's age or underlying illnesses.  The most common symptoms associated with an upper respiratory infection are nasal discharge or congestion, cough, sneezing, headache and pressure in the ears and face.  These symptoms usually persist for about 3 to 10 days, but can last up to 2 weeks.  It is important to know that upper respiratory infections do not cause serious illness or complications in most cases.    Upper respiratory infections can be transmitted from person to person, with the most common method of transmission being a person's hands.  The virus is able to live on the skin and can infect other persons for up to 2 hours after direct contact.  Also, these can be transmitted when someone coughs or sneezes; thus, it is important to cover the mouth to reduce this risk.  To keep the spread of the illness at bay, good hand hygiene is very important.  This is an infection that is most likely caused by a virus. There are no specific treatments other than to help you with the symptoms until the infection runs its course.  We are sorry you are not feeling well.  Here is how we plan to help!   For nasal congestion, you may use an oral decongestants such  as Mucinex D or if you have glaucoma or high blood pressure use plain Mucinex.  Saline nasal spray or nasal drops can help and can safely be used as often as needed for congestion.    If you do not have a history of heart disease, hypertension, diabetes or thyroid disease, prostate/bladder issues or glaucoma, you may also use Sudafed to treat nasal congestion.  It is highly recommended that you consult with a pharmacist or your primary care physician to ensure this medication is safe for you to take.     If you have a cough, you may use cough suppressants such as Delsym and Robitussin.  If you have glaucoma or high blood pressure, you can also use Coricidin HBP.    If you have a sore or scratchy throat, use a saltwater gargle-  to  teaspoon of salt dissolved in a 4-ounce to 8-ounce glass of warm water.  Gargle the solution for approximately 15-30 seconds and then spit.  It is important not to swallow the solution.  You can also use throat lozenges/cough drops and Chloraseptic spray to help with throat pain or discomfort.  Warm or cold liquids can also be helpful in relieving throat pain.  For headache, pain or general discomfort, you can use Ibuprofen or Tylenol as directed.   Some authorities believe that zinc sprays or the use of Echinacea may shorten the course  of your symptoms.   HOME CARE . Only take medications as instructed by your medical team. . Be sure to drink plenty of fluids. Water is fine as well as fruit juices, sodas and electrolyte beverages. You may want to stay away from caffeine or alcohol. If you are nauseated, try taking small sips of liquids. How do you know if you are getting enough fluid? Your urine should be a pale yellow or almost colorless. . Get rest. . Taking a steamy shower or using a humidifier may help nasal congestion and ease sore throat pain. You can place a towel over your head and breathe in the steam from hot water coming from a faucet. . Using a saline nasal  spray works much the same way. . Cough drops, hard candies and sore throat lozenges may ease your cough. . Avoid close contacts especially the very young and the elderly . Cover your mouth if you cough or sneeze . Always remember to wash your hands.   GET HELP RIGHT AWAY IF: . You develop worsening fever. . If your symptoms do not improve within 10 days . You develop yellow or green discharge from your nose over 3 days. . You have coughing fits . You develop a severe head ache or visual changes. . You develop shortness of breath, difficulty breathing or start having chest pain . Your symptoms persist after you have completed your treatment plan  MAKE SURE YOU   Understand these instructions.  Will watch your condition.  Will get help right away if you are not doing well or get worse.  Your e-visit answers were reviewed by a board certified advanced clinical practitioner to complete your personal care plan. Depending upon the condition, your plan could have included both over the counter or prescription medications. Please review your pharmacy choice. If there is a problem, you may call our nursing hot line at and have the prescription routed to another pharmacy. Your safety is important to [place]. If you have drug allergies check your prescription carefully.   You can use MyChart to ask questions about today's visit, request a non-urgent call back, or ask for a work or school excuse for 24 hours related to this e-Visit. If it has been greater than 24 hours you will need to follow up with your provider, or enter a new e-Visit to address those concerns. You will get an e-mail in the next two days asking about your experience.  I hope that your e-visit has been valuable and will speed your recovery. Thank you for using e-visits.   Greater than 5 minutes, yet less than 10 minutes of time have been spent researching, coordinating, and implementing care for this patient today

## 2020-10-17 ENCOUNTER — Ambulatory Visit: Payer: Self-pay

## 2020-10-17 DIAGNOSIS — J019 Acute sinusitis, unspecified: Secondary | ICD-10-CM | POA: Diagnosis not present

## 2020-10-17 DIAGNOSIS — J029 Acute pharyngitis, unspecified: Secondary | ICD-10-CM | POA: Diagnosis not present

## 2020-10-17 DIAGNOSIS — J209 Acute bronchitis, unspecified: Secondary | ICD-10-CM | POA: Diagnosis not present

## 2020-10-17 DIAGNOSIS — J069 Acute upper respiratory infection, unspecified: Secondary | ICD-10-CM | POA: Diagnosis not present

## 2020-10-20 DIAGNOSIS — R091 Pleurisy: Secondary | ICD-10-CM | POA: Diagnosis not present

## 2020-10-20 DIAGNOSIS — J209 Acute bronchitis, unspecified: Secondary | ICD-10-CM | POA: Diagnosis not present

## 2020-11-13 ENCOUNTER — Telehealth: Payer: Medicaid Other | Admitting: Emergency Medicine

## 2020-11-13 DIAGNOSIS — J069 Acute upper respiratory infection, unspecified: Secondary | ICD-10-CM

## 2020-11-13 MED ORDER — FLUTICASONE PROPIONATE 50 MCG/ACT NA SUSP: 2.0000 | Freq: Every day | NASAL | 0 refills | Status: AC

## 2020-11-13 MED ORDER — BENZONATATE 100 MG PO CAPS: 100.0000 mg | ORAL_CAPSULE | Freq: Two times a day (BID) | ORAL | 0 refills | Status: AC | PRN

## 2020-11-13 NOTE — Progress Notes (Signed)

## 2021-02-17 ENCOUNTER — Ambulatory Visit (INDEPENDENT_AMBULATORY_CARE_PROVIDER_SITE_OTHER): Payer: Medicaid Other

## 2021-02-17 ENCOUNTER — Encounter (HOSPITAL_COMMUNITY): Payer: Self-pay

## 2021-02-17 ENCOUNTER — Ambulatory Visit (HOSPITAL_COMMUNITY)
Admission: EM | Admit: 2021-02-17 | Discharge: 2021-02-17 | Disposition: A | Discharge status: Home / Self Care | Payer: Medicaid Other | Attending: Urgent Care | Admitting: Urgent Care

## 2021-02-17 ENCOUNTER — Other Ambulatory Visit: Payer: Self-pay

## 2021-02-17 DIAGNOSIS — M25511 Pain in right shoulder: Secondary | ICD-10-CM | POA: Diagnosis not present

## 2021-02-17 DIAGNOSIS — M898X1 Other specified disorders of bone, shoulder: Secondary | ICD-10-CM

## 2021-02-17 DIAGNOSIS — Z87828 Personal history of other (healed) physical injury and trauma: Secondary | ICD-10-CM

## 2021-02-17 DIAGNOSIS — S42001A Fracture of unspecified part of right clavicle, initial encounter for closed fracture: Secondary | ICD-10-CM | POA: Diagnosis not present

## 2021-02-17 DIAGNOSIS — M792 Neuralgia and neuritis, unspecified: Secondary | ICD-10-CM

## 2021-02-17 DIAGNOSIS — Z09 Encounter for follow-up examination after completed treatment for conditions other than malignant neoplasm: Secondary | ICD-10-CM

## 2021-02-17 MED ORDER — PREDNISONE 20 MG PO TABS: ORAL_TABLET | ORAL | 0 refills | Status: AC

## 2021-02-17 MED ORDER — NAPROXEN 500 MG PO TABS: 500.0000 mg | ORAL_TABLET | Freq: Two times a day (BID) | ORAL | 0 refills | Status: AC
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
## 2021-07-03 ENCOUNTER — Ambulatory Visit: Payer: Medicaid Other | Admitting: Family Medicine
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
## 2022-07-17 NOTE — Progress Notes (Signed)
No chief complaint on file.
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
## 2023-12-01 ENCOUNTER — Encounter (HOSPITAL_COMMUNITY): Payer: Self-pay

## 2023-12-01 ENCOUNTER — Emergency Department (HOSPITAL_COMMUNITY)
Admission: EM | Admit: 2023-12-01 | Discharge: 2023-12-01 | Disposition: A | Discharge status: Home / Self Care | Payer: Medicaid Other | Attending: Emergency Medicine | Admitting: Emergency Medicine

## 2023-12-01 ENCOUNTER — Other Ambulatory Visit: Payer: Self-pay

## 2023-12-01 DIAGNOSIS — R0781 Pleurodynia: Secondary | ICD-10-CM | POA: Insufficient documentation

## 2023-12-01 DIAGNOSIS — W1839XA Other fall on same level, initial encounter: Secondary | ICD-10-CM | POA: Insufficient documentation

## 2023-12-01 DIAGNOSIS — Z0000 Encounter for general adult medical examination without abnormal findings: Secondary | ICD-10-CM | POA: Insufficient documentation

## 2023-12-01 DIAGNOSIS — Z0279 Encounter for issue of other medical certificate: Secondary | ICD-10-CM | POA: Diagnosis present

## 2023-12-01 HISTORY — DX: Unspecified firearm discharge, undetermined intent, initial encounter: Y24.9XXA

## 2023-12-01 HISTORY — DX: Accidental discharge from unspecified firearms or gun, initial encounter: W34.00XA

## 2023-12-01 NOTE — Discharge Instructions (Signed)
 Thank you for letting us  evaluate you today.  Your EKG shows normal sinus rhythm and does not have any abnormalities on it.
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
## 2023-12-01 NOTE — ED Provider Notes (Signed)
 [facility] AT [facility] Provider Note   CSN: [id number] Arrival date & time: 12/01/23  1528     History  Chief Complaint  Patient presents with   Medical Clearance    Christopher Rama. is a 23 y.o. male with pmhx of substance abuse and currently on methadone presents to ED for EKG required for medical clearance for [facility].   Of note, he overdosed on illicit drugs last week causing him to have apneic episode and fall onto left side. He endorses mild left rib pain from fall. No CP, SOB, or other complaints  HPI     Home Medications Prior to Admission medications   Medication Sig Start Date End Date Taking? Authorizing Provider  albuterol (VENTOLIN HFA) 108 (90 Base) MCG/ACT inhaler Inhale 2 puffs into the lungs every 6 (six) hours as needed for wheezing or shortness of breath. 08/08/20   Couture, Cortni S, PA-C  benzonatate (TESSALON) 100 MG capsule Take 1 capsule (100 mg total) by mouth 2 (two) times daily as needed for cough. 11/13/20   Roxy Horseman, PA-C  fluticasone (FLONASE) 50 MCG/ACT nasal spray Place 2 sprays into both nostrils daily. 11/13/20   Roxy Horseman, PA-C  ibuprofen (ADVIL) 600 MG tablet Take 1 tablet (600 mg total) by mouth every 8 (eight) hours as needed. 01/10/20   Elmore Guise, FNP  naproxen (NAPROSYN) 500 MG tablet Take 1 tablet (500 mg total) by mouth 2 (two) times daily with a meal. 02/17/21   Wallis Bamberg, PA-C  ondansetron (ZOFRAN) 4 MG tablet Take 1 tablet (4 mg total) by mouth every 8 (eight) hours as needed for nausea or vomiting. 10/04/20   Bennie Pierini, FNP  predniSONE (DELTASONE) 20 MG tablet Take 2 tablets daily with breakfast. 02/17/21   Wallis Bamberg, PA-C      Allergies    Patient has no known allergies.    Review of Systems   Review of Systems  Constitutional:  Negative for chills, fatigue and fever.  Respiratory:  Negative for cough, chest tightness, shortness of breath and wheezing.    Cardiovascular:  Negative for chest pain and palpitations.  Gastrointestinal:  Negative for abdominal pain, constipation, diarrhea, nausea and vomiting.  Neurological:  Negative for dizziness, seizures, weakness, light-headedness, numbness and headaches.    Physical Exam Updated Vital Signs BP (!) 106/55 (BP Location: Right Arm)   Pulse 86   Temp 98.3 F (36.8 C) (Oral)   Resp 16   Ht 5\' 10"  (1.778 m)   Wt 83 kg   SpO2 100%   BMI 26.26 kg/m  Physical Exam Vitals and nursing note reviewed.  Constitutional:      General: He is not in acute distress.    Appearance: Normal appearance. He is not diaphoretic.  HENT:     Head: Normocephalic and atraumatic.     Comments: No hematoma nor TTP of cranium No crepitus to facial bones    Right Ear: External ear normal. No hemotympanum.     Left Ear: External ear normal. No hemotympanum.     Nose: Nose normal.     Right Nostril: No epistaxis or septal hematoma.     Left Nostril: No epistaxis or septal hematoma.     Mouth/Throat:     Mouth: Mucous membranes are moist. No injury or lacerations.  Eyes:     General:        Right eye: No discharge.        Left eye: No  discharge.     Extraocular Movements: Extraocular movements intact.     Conjunctiva/sclera: Conjunctivae normal.     Pupils: Pupils are equal, round, and reactive to light.     Comments: No subconjunctival hemorrhage, hyphema, tear drop pupil, or fluid leakage bilaterally  Neck:     Vascular: No carotid bruit.  Cardiovascular:     Rate and Rhythm: Normal rate.     Pulses: Normal pulses.          Radial pulses are 2+ on the right side and 2+ on the left side.       Dorsalis pedis pulses are 2+ on the right side and 2+ on the left side.  Pulmonary:     Effort: Pulmonary effort is normal. No respiratory distress.     Breath sounds: Normal breath sounds. No wheezing.  Chest:     Chest wall: No tenderness.  Abdominal:     General: Bowel sounds are normal. There is no  distension.     Palpations: Abdomen is soft.     Tenderness: There is no abdominal tenderness. There is no guarding or rebound.  Musculoskeletal:     Cervical back: Full passive range of motion without pain, normal range of motion and neck supple. No deformity, rigidity or bony tenderness. Normal range of motion.     Thoracic back: No deformity or bony tenderness. Normal range of motion.     Lumbar back: No deformity or bony tenderness. Normal range of motion.     Right hip: No bony tenderness or crepitus.     Left hip: No bony tenderness or crepitus.     Right lower leg: No edema.     Left lower leg: No edema.     Comments: Mild TTP of left lower thoracic cage without crepitus, instability, overlying skin abnormalities No obvious deformity to joints or long bones  Skin:    General: Skin is warm and dry.     Capillary Refill: Capillary refill takes less than 2 seconds.     Findings: No bruising.  Neurological:     General: No focal deficit present.     Mental Status: He is alert and oriented to person, place, and time. Mental status is at baseline.     GCS: GCS eye subscore is 4. GCS verbal subscore is 5. GCS motor subscore is 6.     Cranial Nerves: Cranial nerves 2-12 are intact. No cranial nerve deficit.     Sensory: Sensation is intact. No sensory deficit.     Motor: Motor function is intact. No weakness or tremor.     Coordination: Coordination is intact. Coordination normal. Finger-Nose-Finger Test and Heel to [facility] Test normal.     Gait: Gait is intact. Gait normal.     Deep Tendon Reflexes: Reflexes are normal and symmetric. Reflexes normal.     Comments: following commands appropriately and ambulating without difficulty     ED Results / Procedures / Treatments   Labs (all labs ordered are listed, but only abnormal results are displayed) Labs Reviewed - No data to display   EKG EKG Interpretation Date/Time:  Monday December 01 2023 16:38:00 EST Ventricular Rate:  89 PR  Interval:  136 QRS Duration:  76 QT Interval:  338 QTC Calculation: 411 R Axis:   75  Text Interpretation: Normal sinus rhythm Confirmed by Gloris Manchester [phone]) on 12/01/2023 4:50:37 PM  Radiology No results found.  Procedures Procedures    Medications Ordered in ED Medications - No data  to display  ED Course/ Medical Decision Making/ A&P                                 Medical Decision Making Amount and/or Complexity of Data Reviewed Labs: ordered.   Patient presents to the ED for EKG for clearance for [facility]   Co morbidities that complicate the patient evaluation  Substance abuse   Additional history obtained:  Additional history obtained from Nursing   External records from outside source obtained and reviewed including triage RN note    Cardiac Monitoring:  The patient was maintained on a cardiac monitor.  I personally viewed and interpreted the cardiac monitored which showed an underlying rhythm of: NSR    Problem List / ED Course:  Adult general medical exam Encounter for EKG No concerning physical exam findings. I offered to obtain lab work however he reports that [facility] recently obtain lab work and is not needed for clearance at this time. I also offered to obtain chest x-ray as he has left-sided pain from fall last week.  He refuses at this time He is fully capable of making his own decisions.  No SI, HI, norself-injury EKG is within normal limits showing NSR with no ischemia nor STE.  Patient has no complaints of chest pain, shortness of breath Discussed EKG, return to emerged part precautions with patient expressed understanding with the plan.  All questions answered to her satisfaction.  He is agreeable discharge.   Reevaluation:  After the interventions noted above, I reevaluated the patient and found that they have :stayed the same    Dispostion:  After consideration of the diagnostic results and the patients response to treatment,  I feel that the patent would benefit from outpatient management with [facility].    Final Clinical Impression(s) / ED Diagnoses Final diagnoses:  Adult general medical exam    Rx / DC Orders ED Discharge Orders     None         Judithann Sheen, PA 12/01/23 1701    Gloris Manchester, MD 12/02/23 [phone]
